# Patient Record
Sex: Male | Born: 1990 | Hispanic: Yes | Marital: Single | Smoking: Never smoker
Health system: Southern US, Community
[De-identification: ages and names within clinical notes are randomized; demographics above are authoritative.]

## PROBLEM LIST (undated history)

## (undated) DIAGNOSIS — J45909 Unspecified asthma, uncomplicated: Secondary | ICD-10-CM

---

## 2017-02-06 ENCOUNTER — Emergency Department (HOSPITAL_COMMUNITY): Payer: Self-pay

## 2017-02-06 ENCOUNTER — Encounter (HOSPITAL_COMMUNITY): Payer: Self-pay

## 2017-02-06 ENCOUNTER — Emergency Department (HOSPITAL_COMMUNITY)
Admission: EM | Admit: 2017-02-06 | Discharge: 2017-02-07 | Disposition: A | Discharge status: Home / Self Care | Payer: Self-pay | Attending: Emergency Medicine | Admitting: Emergency Medicine

## 2017-02-06 DIAGNOSIS — Y990 Civilian activity done for income or pay: Secondary | ICD-10-CM | POA: Insufficient documentation

## 2017-02-06 DIAGNOSIS — M545 Low back pain, unspecified: Secondary | ICD-10-CM

## 2017-02-06 DIAGNOSIS — Y9289 Other specified places as the place of occurrence of the external cause: Secondary | ICD-10-CM | POA: Insufficient documentation

## 2017-02-06 DIAGNOSIS — M25511 Pain in right shoulder: Secondary | ICD-10-CM | POA: Insufficient documentation

## 2017-02-06 DIAGNOSIS — J45909 Unspecified asthma, uncomplicated: Secondary | ICD-10-CM | POA: Insufficient documentation

## 2017-02-06 DIAGNOSIS — W11XXXA Fall on and from ladder, initial encounter: Secondary | ICD-10-CM | POA: Insufficient documentation

## 2017-02-06 DIAGNOSIS — Y9389 Activity, other specified: Secondary | ICD-10-CM | POA: Insufficient documentation

## 2017-02-06 HISTORY — DX: Unspecified asthma, uncomplicated: J45.909

## 2017-02-06 NOTE — ED Triage Notes (Signed)
Pt was at work and fell off of a lift from 12 feet onto his back by accident. Pt complains of lumbar pain. Denies neuro sx, dizziness and no LOC. Pt did not hit head. VSS. Pt was already seen at a clinic but wanted to come here in case he needed x-ray. Ambulatory and able to move all extremities.

## 2017-02-06 NOTE — ED Provider Notes (Signed)
[facility] DEPT Provider Note   CSN: [id number] Arrival date & time: 02/06/17  1909     History   Chief Complaint Chief Complaint  Patient presents with  . Fall  . Back Pain    HPI John Huynh is a 26 y.o. male.  The history is provided by the patient and medical records. The history is limited by a language barrier. A language interpreter was used.  Fall   Back Pain     27 y.o. M with hx of asthma, presenting to the ED for back pain.  Patient reports he was at work today and fell off a 12 foot ladder.  States he fell flat onto his back.  No head injury or LOC.  States throughout the day he has had ongoing back pain, worse from mid-back down to lumbar area.  States he was seen at clinic earlier today but did not have any imaging done.  He is concerned that he needs an x-ray.  States he is also having some right shoulder pain.  He denies any numbness/weakness of his arms or legs.  No bowel or bladder incontinence.  Denies any neck pain.  No headache or confusion.  No nausea/vomiting.  No intervention tried PTA.  Past Medical History:  Diagnosis Date  . Asthma     There are no active problems to display for this patient.   History reviewed. No pertinent surgical history.     Home Medications    Prior to Admission medications   Not on File    Family History History reviewed. No pertinent family history.  Social History Social History  Substance Use Topics  . Smoking status: Never Smoker  . Smokeless tobacco: Not on file  . Alcohol use Yes     Comment: on weekends     Allergies   Patient has no known allergies.   Review of Systems Review of Systems  Musculoskeletal: Positive for arthralgias and back pain.  All other systems reviewed and are negative.    Physical Exam Updated Vital Signs BP 124/68   Pulse 60   Temp 98.8 F (37.1 C) (Oral)   Resp 17   Ht  (1.727 m)   Wt 77.1 kg   SpO2 97%   BMI 25.85 kg/m   Physical  Exam  Constitutional: He is oriented to person, place, and time. He appears well-developed and well-nourished.  HENT:  Head: Normocephalic and atraumatic.  Mouth/Throat: Oropharynx is clear and moist.  No visible signs of head trauma  Eyes: Conjunctivae and EOM are normal. Pupils are equal, round, and reactive to light.  Neck: Normal range of motion.  Cardiovascular: Normal rate, regular rhythm and normal heart sounds.   Pulmonary/Chest: Effort normal and breath sounds normal.  Abdominal: Soft. Bowel sounds are normal.  Musculoskeletal: Normal range of motion.       Right shoulder: He exhibits pain.       Cervical back: Normal.       Thoracic back: He exhibits tenderness, bony tenderness and pain.       Lumbar back: He exhibits tenderness, bony tenderness and pain.       Back:  Midline tenderness along thoracic and lumbar spine; no noted step-off or deformities; full ROM maintained; normal strength and sensation of all 4 extremities, normal gait Cervical spine non-tender Right shoulder has pain with ROM, no deformities or signs of dislocation noted  Neurological: He is alert and oriented to person, place, and time.  AAOx3, answering questions  and following commands appropriately; equal strength UE and LE bilaterally; CN grossly intact; moves all extremities appropriately without ataxia; no focal neuro deficits or facial asymmetry appreciated  Skin: Skin is warm and dry.  Psychiatric: He has a normal mood and affect.  Nursing note and vitals reviewed.    ED Treatments / Results  Labs (all labs ordered are listed, but only abnormal results are displayed) Labs Reviewed - No data to display  EKG  EKG Interpretation None       Radiology Dg Thoracic Spine 2 View  Result Date: 02/06/2017 CLINICAL DATA:  Initial evaluation for acute back pain status post fall. EXAM: THORACIC SPINE 2 VIEWS COMPARISON:  None. FINDINGS: There is no evidence of thoracic spine fracture. Trace  dextroscoliosis. Vertebral bodies otherwise normally aligned with preservation of the normal thoracic kyphosis. No other significant bone abnormalities are identified. IMPRESSION: No radiographic evidence for acute traumatic injury within the thoracic spine. Electronically Signed   By: Rise Mu M.D.   On: 02/06/2017 23:40   Dg Lumbar Spine Complete  Result Date: 02/06/2017 CLINICAL DATA:  Initial evaluation for acute low back pain status post fall. EXAM: LUMBAR SPINE - COMPLETE 4+ VIEW COMPARISON:  None. FINDINGS: There is no evidence of lumbar spine fracture. Alignment is normal. Intervertebral disc spaces are maintained. IMPRESSION: No radiographic evidence for acute traumatic injury within the lumbar spine. Electronically Signed   By: Rise Mu M.D.   On: 02/06/2017 23:43   Dg Shoulder Right  Result Date: 02/06/2017 CLINICAL DATA:  Initial evaluation for acute trauma, fall, right shoulder pain. EXAM: RIGHT SHOULDER - 2+ VIEW COMPARISON:  None. FINDINGS: There is no evidence of fracture or dislocation. There is no evidence of arthropathy or other focal bone abnormality. Soft tissues are unremarkable. IMPRESSION: No acute fracture or dislocation about the right shoulder. Electronically Signed   By: Rise Mu M.D.   On: 02/06/2017 23:38    Procedures Procedures (including critical care time)  Medications Ordered in ED Medications - No data to display   Initial Impression / Assessment and Plan / ED Course  I have reviewed the triage vital signs and the nursing notes.  Pertinent labs & imaging results that were available during my care of the patient were reviewed by me and considered in my medical decision making (see chart for details).  26 y.o. M here after fall off 12 ft ladder at work today.  Denies head injury or LOC.  Complaints of back and right shoulder pain.  No apparent deformities noted on exam.  Does have some pain with ROM of right shoulder and  tenderness along thoracic and lumbar spine.  No focal neurologic deficits suggestive of acute spinal cord injury.  Remains ambulatory with steady gait.  X-rays obtained and are negative for acute findings.    Remains stable here without apparent deficits.  Will plan to discharge home with supportive care.  Does not have PCP, so will refer to wellness clinic for follow-up.  Discussed plan with patient via language interpreter, he acknowledged understanding and agreed with plan of care.  Return precautions given for new or worsening symptoms.  Final Clinical Impressions(s) / ED Diagnoses   Final diagnoses:  Fall from ladder, initial encounter  Acute midline low back pain without sciatica  Acute pain of right shoulder    New Prescriptions New Prescriptions   No medications on file     Garlon Hatchet, PA-C 02/07/17 0039    Shaune Pollack, MD 02/07/17 1511

## 2017-02-07 MED ORDER — NAPROXEN 500 MG PO TABS: 500.0000 mg | ORAL_TABLET | Freq: Two times a day (BID) | ORAL | 0 refills | Status: DC

## 2017-02-07 MED ORDER — METHOCARBAMOL 500 MG PO TABS: 500.0000 mg | ORAL_TABLET | Freq: Two times a day (BID) | ORAL | 0 refills | Status: DC

## 2017-02-07 NOTE — ED Notes (Signed)
Pt stable, understands discharge instructions, and reasons for return.   

## 2017-02-07 NOTE — Discharge Instructions (Signed)
X-rays today were normal-- no evidence of fractures. Take the prescribed medication as directed. Follow-up with your primary care doctor.  If you do not have one, you can call the wellness clinic. Return to the ED for new or worsening symptoms.

## 2017-06-01 ENCOUNTER — Ambulatory Visit (INDEPENDENT_AMBULATORY_CARE_PROVIDER_SITE_OTHER): Payer: Self-pay

## 2017-06-01 ENCOUNTER — Ambulatory Visit (INDEPENDENT_AMBULATORY_CARE_PROVIDER_SITE_OTHER): Payer: Self-pay | Admitting: Family Medicine

## 2017-06-01 ENCOUNTER — Encounter: Payer: Self-pay | Admitting: Family Medicine

## 2017-06-01 VITALS — BP 121/71 | HR 62 | Temp 98.4°F | Resp 16 | Ht 67.25 in | Wt 185.6 lb

## 2017-06-01 DIAGNOSIS — K5900 Constipation, unspecified: Secondary | ICD-10-CM

## 2017-06-01 DIAGNOSIS — R1032 Left lower quadrant pain: Secondary | ICD-10-CM

## 2017-06-01 LAB — POCT URINALYSIS DIP (MANUAL ENTRY)
Bilirubin, UA: NEGATIVE
Blood, UA: NEGATIVE
Glucose, UA: NEGATIVE mg/dL
Ketones, POC UA: NEGATIVE mg/dL
Leukocytes, UA: NEGATIVE
Nitrite, UA: NEGATIVE
Spec Grav, UA: 1.02 (ref 1.010–1.025)
Urobilinogen, UA: 0.2 E.U./dL
pH, UA: 7.5 (ref 5.0–8.0)

## 2017-06-01 LAB — POCT CBC
Granulocyte percent: 61.2 %G (ref 37–80)
HCT, POC: 46.5 % (ref 43.5–53.7)
Hemoglobin: 15.4 g/dL (ref 14.1–18.1)
Lymph, poc: 1.8 (ref 0.6–3.4)
MCH, POC: 29.6 pg (ref 27–31.2)
MCHC: 33.2 g/dL (ref 31.8–35.4)
MCV: 89.3 fL (ref 80–97)
MID (cbc): 0.6 (ref 0–0.9)
MPV: 9 fL (ref 0–99.8)
POC Granulocyte: 3.7 (ref 2–6.9)
POC LYMPH PERCENT: 29 %L (ref 10–50)
POC MID %: 9.8 %M (ref 0–12)
Platelet Count, POC: 228 10*3/uL (ref 142–424)
RBC: 5.21 M/uL (ref 4.69–6.13)
RDW, POC: 13.5 %
WBC: 6.1 10*3/uL (ref 4.6–10.2)

## 2017-06-01 LAB — POC MICROSCOPIC URINALYSIS (UMFC): Mucus: ABSENT

## 2017-06-01 NOTE — Patient Instructions (Signed)
     IF you received an x-ray today, you will receive an invoice from [facility]. Please contact [facility] at [phone] with questions or concerns regarding your invoice.   IF you received labwork today, you will receive an invoice from [facility]. Please contact [facility] at [phone] with questions or concerns regarding your invoice.   Our billing staff will not be able to assist you with questions regarding bills from these companies.  You will be contacted with the lab results as soon as they are available. The fastest way to get your results is to activate your My Chart account. Instructions are located on the last page of this paperwork. If you have not heard from us regarding the results in 2 weeks, please contact this office.     

## 2017-06-03 ENCOUNTER — Encounter: Payer: Self-pay | Admitting: Family Medicine

## 2017-06-03 NOTE — Progress Notes (Signed)
8/16/201810:34 AM  [facility] [place] [birth year], 26 y.o. male [id number]  Chief Complaint  Patient presents with  . Abdominal Pain    x 1 month,     HPI:   Patient is a 26 y.o. male who presents today for one month of abdominal pain, mostly left side. Patient reports that pain is deep and achy, intermittent but becoming more constant, will radiate through out abdomen, associated with constipation that has sign improved since he started magnesium 3 days ago. He reports decreased appetite, bloating and nausea but denies vomiting, fever, chills, black tarry stools or blood in stools. Prior to pain he denies any recent travel history, GU symptoms or abnormal physical activity. Has tried anti-sposmadic and diclofenac (from [place]) without any improvement in sx.   Depression screen PHQ 2/9 06/01/2017  Decreased Interest 0  Down, Depressed, Hopeless 0  PHQ - 2 Score 0    No Known Allergies  Current Outpatient Prescriptions on File Prior to Visit  Medication Sig Dispense Refill  . diclofenac (VOLTAREN) 75 MG EC tablet Take 75 mg by mouth 2 (two) times daily.     No current facility-administered medications on file prior to visit.     Past Medical History:  Diagnosis Date  . Asthma     No past surgical history on file.  Social History  Substance Use Topics  . Smoking status: Never Smoker  . Smokeless tobacco: Never Used  . Alcohol use Yes     Comment: on weekends    Family History  Problem Relation Age of Onset  . GI problems Neg Hx     Review of Systems  Constitutional: Negative for chills, fever, malaise/fatigue and weight loss.  Respiratory: Negative for cough and shortness of breath.   Cardiovascular: Negative for chest pain and palpitations.  Gastrointestinal: Positive for abdominal pain, constipation and nausea. Negative for blood in stool, diarrhea, heartburn, melena and vomiting.  Genitourinary: Negative for dysuria and hematuria.    OBJECTIVE:  Vitals:     06/01/17 1500  Weight: 185 lb 9.6 oz (84.2 kg)  Height: 5' 7.25" (1.708 m)    Physical Exam  Constitutional: He is oriented to person, place, and time and well-developed, well-nourished, and in no distress.  HENT:  Head: Normocephalic and atraumatic.  Mouth/Throat: Oropharynx is clear and moist.  Eyes: Pupils are equal, round, and reactive to light. EOM are normal.  Neck: Neck supple.  Cardiovascular: Normal rate and regular rhythm.  Exam reveals no gallop and no friction rub.   No murmur heard. Pulmonary/Chest: Effort normal and breath sounds normal. He has no wheezes. He has no rales.  Abdominal: Soft. Bowel sounds are normal. He exhibits no distension and no mass. There is tenderness (moderate LLQ TTP). There is no rebound and no guarding.  Neurological: He is alert and oriented to person, place, and time. Gait normal.  Skin: Skin is warm and dry.    Results for orders placed or performed in visit on 06/01/17  POCT urinalysis dipstick  Result Value Ref Range   Color, UA yellow yellow   Clarity, UA clear clear   Glucose, UA negative negative mg/dL   Bilirubin, UA negative negative   Ketones, POC UA negative negative mg/dL   Spec Grav, UA 4.098 1.191 - 1.025   Blood, UA negative negative   pH, UA 7.5 5.0 - 8.0   Protein Ur, POC trace (A) negative mg/dL   Urobilinogen, UA 0.2 0.2 or 1.0 E.U./dL   Nitrite, UA Negative  Negative   Leukocytes, UA Negative Negative  POCT Microscopic Urinalysis ([facility])  Result Value Ref Range   WBC,UR,HPF,POC None None WBC/hpf   RBC,UR,HPF,POC None None RBC/hpf   Bacteria None None, Too numerous to count   Mucus Absent Absent   Epithelial Cells, UR Per Microscopy Few (A) None, Too numerous to count cells/hpf  POCT CBC  Result Value Ref Range   WBC 6.1 4.6 - 10.2 K/uL   Lymph, poc 1.8 0.6 - 3.4   POC LYMPH PERCENT 29.0 10 - 50 %L   MID (cbc) 0.6 0 - 0.9   POC MID % 9.8 0 - 12 %M   POC Granulocyte 3.7 2 - 6.9   Granulocyte percent 61.2 37  - 80 %G   RBC 5.21 4.69 - 6.13 M/uL   Hemoglobin 15.4 14.1 - 18.1 g/dL   HCT, POC 09.846.5 11.943.5 - 53.7 %   MCV 89.3 80 - 97 fL   MCH, POC 29.6 27 - 31.2 pg   MCHC 33.2 31.8 - 35.4 g/dL   RDW, POC 14.713.5 %   Platelet Count, POC 228 142 - 424 K/uL   MPV 9.0 0 - 99.8 fL     ASSESSMENT and PLAN:  Problem List Items Addressed This Visit    None    Visit Diagnoses    Abdominal pain, left lower quadrant    -  Primary   Relevant Orders   POCT urinalysis dipstick (Completed)   POCT Microscopic Urinalysis ([facility]) (Completed)   DG Abd 2 Views (Completed)   POCT CBC (Completed)   Constipation, unspecified constipation type          Diescussed with patient that workup today has been reassuring, xray suggestive of unresolved constipation. Discussed conservative measures for constipation, increase water intake, fiber supplements such as generic metamucil, prune juice, OTC miralax. ER precautions given. RTC instructions discussed.      Myles LippsIrma M Santiago, MD [facility] at [facility] [address] [place], [place] [address] Ph.  [phone] Fax [phone]

## 2017-08-05 ENCOUNTER — Encounter: Payer: Self-pay | Admitting: Family Medicine

## 2017-08-05 ENCOUNTER — Ambulatory Visit (INDEPENDENT_AMBULATORY_CARE_PROVIDER_SITE_OTHER): Payer: Self-pay

## 2017-08-05 ENCOUNTER — Ambulatory Visit (INDEPENDENT_AMBULATORY_CARE_PROVIDER_SITE_OTHER): Payer: Self-pay | Admitting: Family Medicine

## 2017-08-05 VITALS — BP 132/88 | HR 89 | Temp 98.0°F | Resp 18 | Ht 67.25 in | Wt 175.0 lb

## 2017-08-05 DIAGNOSIS — R1030 Lower abdominal pain, unspecified: Secondary | ICD-10-CM

## 2017-08-05 DIAGNOSIS — R1032 Left lower quadrant pain: Secondary | ICD-10-CM

## 2017-08-05 DIAGNOSIS — R634 Abnormal weight loss: Secondary | ICD-10-CM

## 2017-08-05 LAB — POCT URINALYSIS DIP (MANUAL ENTRY)
Bilirubin, UA: NEGATIVE
Blood, UA: NEGATIVE
Glucose, UA: NEGATIVE mg/dL
Leukocytes, UA: NEGATIVE
Nitrite, UA: NEGATIVE
Protein Ur, POC: NEGATIVE mg/dL
Spec Grav, UA: 1.025 (ref 1.010–1.025)
Urobilinogen, UA: 0.2 E.U./dL
pH, UA: 5.5 (ref 5.0–8.0)

## 2017-08-05 MED ORDER — LACTULOSE 10 GM/15ML PO SOLN: 10.0000 g | Freq: Three times a day (TID) | ORAL | 0 refills | Status: AC

## 2017-08-05 NOTE — Patient Instructions (Signed)
     IF you received an x-ray today, you will receive an invoice from [facility]. Please contact [facility] at [phone] with questions or concerns regarding your invoice.   IF you received labwork today, you will receive an invoice from [facility]. Please contact [facility] at [phone] with questions or concerns regarding your invoice.   Our billing staff will not be able to assist you with questions regarding bills from these companies.  You will be contacted with the lab results as soon as they are available. The fastest way to get your results is to activate your My Chart account. Instructions are located on the last page of this paperwork. If you have not heard from us regarding the results in 2 weeks, please contact this office.     

## 2017-08-05 NOTE — Progress Notes (Signed)
10/18/20185:48 PM  [facility] [place] May 19, 1991, 26 y.o. male [id number]  Chief Complaint  Patient presents with  . Abdominal Pain    pain all in abdomen and back x42months follow up     HPI:   Patient is a 26 y.o. male who presents today for worsening abd pain x 4 months. Mostly LLQ pain, becoming more constant and sharper/ more intense. Associated with bloating, nausea and vomiting x 1, yesterday morning. Reports early satiety with 10 lbs weight loss since last visit 2 months ago. Reports worsening constipation, gets urge to defecate but passes very little to nothing. Reports decreased passing of gas, increased belching. Denies any fever or chills, denies any urinary symptoms.  Depression screen [facility] 2/9 08/05/2017 06/01/2017  Decreased Interest 0 0  Down, Depressed, Hopeless 0 0  PHQ - 2 Score 0 0    No Known Allergies  Prior to Admission medications   Medication Sig Start Date End Date Taking? Authorizing Provider  diclofenac (VOLTAREN) 75 MG EC tablet Take 75 mg by mouth 2 (two) times daily.    [provider]  lactulose (CHRONULAC) 10 GM/15ML solution Take 15 mLs (10 g total) by mouth 3 (three) times daily. 08/05/17   Myles Lipps, MD    Past Medical History:  Diagnosis Date  . Asthma     History reviewed. No pertinent surgical history.  Social History  Substance Use Topics  . Smoking status: Never Smoker  . Smokeless tobacco: Never Used  . Alcohol use Yes     Comment: on weekends    Family History  Problem Relation Age of Onset  . GI problems Neg Hx     Review of Systems  Constitutional: Positive for malaise/fatigue and weight loss. Negative for chills and fever.  Respiratory: Negative for cough and shortness of breath.   Cardiovascular: Negative for chest pain and palpitations.  Gastrointestinal: Positive for abdominal pain, constipation, nausea and vomiting. Negative for blood in stool and melena.     OBJECTIVE:  Blood pressure  132/88, pulse 89, temperature 98 F (36.7 C), temperature source Oral, resp. rate 18, height 5' 7.25" (1.708 m), weight 175 lb (79.4 kg), SpO2 99 %.    Physical Exam  Constitutional: He is oriented to person, place, and time and well-developed, well-nourished, and in no distress.  HENT:  Head: Normocephalic and atraumatic.  Mouth/Throat: Oropharynx is clear and moist.  Eyes: Pupils are equal, round, and reactive to light. EOM are normal.  Neck: Neck supple.  Cardiovascular: Normal rate and regular rhythm.  Exam reveals no gallop and no friction rub.   No murmur heard. Pulmonary/Chest: Effort normal and breath sounds normal. He has no wheezes. He has no rales.  Abdominal: Soft. Bowel sounds are normal. He exhibits no distension and no mass. There is no hepatosplenomegaly. There is tenderness in the left upper quadrant and left lower quadrant. There is no rigidity, no rebound and no guarding. No hernia.  Neurological: He is alert and oriented to person, place, and time. Gait normal.  Skin: Skin is warm and dry.      Results for orders placed or performed in visit on 08/05/17 (from the past 24 hour(s))  POCT urinalysis dipstick     Status: Abnormal   Collection Time: 08/05/17 12:17 PM  Result Value Ref Range   Color, UA yellow yellow   Clarity, UA clear clear   Glucose, UA negative negative mg/dL   Bilirubin, UA negative negative   Ketones, POC UA small (  15) (A) negative mg/dL   Spec Grav, UA 4.6961.025 2.9521.010 - 1.025   Blood, UA negative negative   pH, UA 5.5 5.0 - 8.0   Protein Ur, POC negative negative mg/dL   Urobilinogen, UA 0.2 0.2 or 1.0 E.U./dL   Nitrite, UA Negative Negative   Leukocytes, UA Negative Negative    Dg Abd 2 Views  Result Date: 08/05/2017 CLINICAL DATA:  Worsening left lower quadrant pain and unintentional weight loss. EXAM: ABDOMEN - 2 VIEW COMPARISON:  06/01/2017 FINDINGS: The bowel gas pattern is normal. There is no evidence of free air. No radio-opaque  calculi or other significant radiographic abnormality is seen. Right pelvic phlebolith again noted. IMPRESSION: Negative. Electronically Signed   By: Myles RosenthalJohn  Stahl M.D.   On: 08/05/2017 13:18     ASSESSMENT and PLAN 1. Left lower quadrant pain Exam overall benign, xray again shows constipation, however weight loss is concerning. Will start investigation with CT. Using lactulose for constipation.  - POCT urinalysis dipstick - CBC with Differential - Comprehensive metabolic panel - DG Abd 2 Views; Future - CT ABDOMEN PELVIS W CONTRAST; Future  2. Abnormal weight loss - CT ABDOMEN PELVIS W CONTRAST; Future  Other orders - lactulose (CHRONULAC) 10 GM/15ML solution; Take 15 mLs (10 g total) by mouth 3 (three) times daily.  Return in about 2 weeks (around 08/19/2017) for after CT.    Myles LippsIrma M Santiago, MD Primary Care at [facility] [address] [place], [place] [address] Ph.  [phone] Fax [phone]

## 2017-08-06 LAB — CBC WITH DIFFERENTIAL/PLATELET
Basophils Absolute: 0 10*3/uL (ref 0.0–0.2)
Basos: 0 %
EOS (ABSOLUTE): 0.1 10*3/uL (ref 0.0–0.4)
Eos: 2 %
Hematocrit: 47.2 % (ref 37.5–51.0)
Hemoglobin: 15.9 g/dL (ref 13.0–17.7)
Immature Grans (Abs): 0 10*3/uL (ref 0.0–0.1)
Immature Granulocytes: 0 %
Lymphocytes Absolute: 1.6 10*3/uL (ref 0.7–3.1)
Lymphs: 26 %
MCH: 30.1 pg (ref 26.6–33.0)
MCHC: 33.7 g/dL (ref 31.5–35.7)
MCV: 89 fL (ref 79–97)
Monocytes Absolute: 0.4 10*3/uL (ref 0.1–0.9)
Monocytes: 7 %
Neutrophils Absolute: 3.9 10*3/uL (ref 1.4–7.0)
Neutrophils: 65 %
Platelets: 253 10*3/uL (ref 150–379)
RBC: 5.28 x10E6/uL (ref 4.14–5.80)
RDW: 14.4 % (ref 12.3–15.4)
WBC: 6.1 10*3/uL (ref 3.4–10.8)

## 2017-08-06 LAB — COMPREHENSIVE METABOLIC PANEL
ALT: 21 IU/L (ref 0–44)
AST: 18 IU/L (ref 0–40)
Albumin/Globulin Ratio: 2 (ref 1.2–2.2)
Albumin: 4.9 g/dL (ref 3.5–5.5)
Alkaline Phosphatase: 75 IU/L (ref 39–117)
BUN/Creatinine Ratio: 13 (ref 9–20)
BUN: 11 mg/dL (ref 6–20)
Bilirubin Total: 1.3 mg/dL — ABNORMAL HIGH (ref 0.0–1.2)
CO2: 21 mmol/L (ref 20–29)
Calcium: 9.6 mg/dL (ref 8.7–10.2)
Chloride: 104 mmol/L (ref 96–106)
Creatinine, Ser: 0.85 mg/dL (ref 0.76–1.27)
GFR calc Af Amer: 140 mL/min/{1.73_m2} (ref >59)
GFR calc non Af Amer: 121 mL/min/{1.73_m2} (ref >59)
Globulin, Total: 2.5 g/dL (ref 1.5–4.5)
Glucose: 88 mg/dL (ref 65–99)
Potassium: 4.3 mmol/L (ref 3.5–5.2)
Sodium: 144 mmol/L (ref 134–144)
Total Protein: 7.4 g/dL (ref 6.0–8.5)

## 2017-08-07 ENCOUNTER — Emergency Department (HOSPITAL_COMMUNITY)
Admission: EM | Admit: 2017-08-07 | Discharge: 2017-08-07 | Disposition: A | Discharge status: Home / Self Care | Payer: Self-pay | Attending: Emergency Medicine | Admitting: Emergency Medicine

## 2017-08-07 ENCOUNTER — Emergency Department (HOSPITAL_COMMUNITY): Payer: Self-pay

## 2017-08-07 ENCOUNTER — Encounter (HOSPITAL_COMMUNITY): Payer: Self-pay | Admitting: Emergency Medicine

## 2017-08-07 DIAGNOSIS — R1084 Generalized abdominal pain: Secondary | ICD-10-CM | POA: Insufficient documentation

## 2017-08-07 DIAGNOSIS — J45909 Unspecified asthma, uncomplicated: Secondary | ICD-10-CM | POA: Insufficient documentation

## 2017-08-07 DIAGNOSIS — K5900 Constipation, unspecified: Secondary | ICD-10-CM | POA: Insufficient documentation

## 2017-08-07 DIAGNOSIS — Z79899 Other long term (current) drug therapy: Secondary | ICD-10-CM | POA: Insufficient documentation

## 2017-08-07 MED ORDER — ESOMEPRAZOLE MAGNESIUM 40 MG PO CPDR: 40.0000 mg | DELAYED_RELEASE_CAPSULE | Freq: Every day | ORAL | 0 refills | Status: AC

## 2017-08-07 MED ORDER — IOPAMIDOL (ISOVUE-300) INJECTION 61%
INTRAVENOUS | Status: AC
  Administered 2017-08-07: 100 mL
  Filled 2017-08-07: qty 100

## 2017-08-07 MED ORDER — MORPHINE SULFATE (PF) 4 MG/ML IV SOLN
2.0000 mg | Freq: Once | INTRAVENOUS | Status: AC
  Administered 2017-08-07: 2 mg via INTRAVENOUS
  Filled 2017-08-07: qty 1

## 2017-08-07 MED ORDER — POLYETHYLENE GLYCOL 3350 17 GM/SCOOP PO POWD: ORAL | 0 refills | Status: AC

## 2017-08-07 NOTE — ED Provider Notes (Signed)
[facility]   CSN: [id number] Arrival date & time: 08/07/17  [id number]     History   Chief Complaint Chief Complaint  Patient presents with  . Abdominal Pain  . Back Pain    HPI John Huynh is a 26 y.o. male presenting with abdominal pain.  Patient states that for the past several months, he has been having abdominal pain that radiates to his back.  This is worsened over the past 2 weeks.  He saw his primary care doctor on the 18th, and a CT scan was scheduled for Monday.  He comes in today, as the pain is unbearable.  The pain is located centrally/umbilical, it is sharp, and constant.  It radiates to his back.  He reports his abdomen feels full, and like he needs to have a bowel movement, but is unable to do so.  Nothing makes it better, eating makes it worse.  Due to this, he has had decreased oral intake.  Prior to 4 months ago, he never had any abdominal problems.  He has tried Tylenol and ibuprofen without relief of the pain.  He took his first dose of lactulose yesterday.  Took Robaxin yesterday, which improved his back pain, but did not help his abdominal pain.  He reports intermittent nausea, none currently.  He reports one episode of vomiting several days ago, it was nonbloody and nonbilious.  He reports abnormal bowel movements, as he has to strain.  No blood in the stool.  PCP notes indicate patient has been constipated in the past several visits.  He denies fevers, chest pain, shortness of breath, urinary symptoms.  He states in the past 2 months, he has lost 10 pounds.  He denies night sweats, but states that he is often chilly.  Family history of gastritis and colitis, but no history of UC or Crohn's. Patient had lab work on the 18th, 2 days ago, without acute abnormality.   HPI  Past Medical History:  Diagnosis Date  . Asthma     There are no active problems to display for this patient.   History reviewed. No  pertinent surgical history.     Home Medications    Prior to Admission medications   Medication Sig Start Date End Date Taking? Authorizing Provider  diclofenac (VOLTAREN) 75 MG EC tablet Take 75 mg by mouth daily as needed for mild pain.    Yes [provider]  lactulose (CHRONULAC) 10 GM/15ML solution Take 15 mLs (10 g total) by mouth 3 (three) times daily. 08/05/17  Yes Myles LippsSantiago, Irma M, MD  esomeprazole (NEXIUM) 40 MG capsule Take 1 capsule (40 mg total) by mouth daily. 08/07/17   Mariha Sleeper, PA-C  polyethylene glycol powder (GLYCOLAX/MIRALAX) powder Take one capful 1-2 times daily as needed to achieve normal bowel movements. 08/07/17   Kiari Hosmer, PA-C    Family History Family History  Problem Relation Age of Onset  . GI problems Neg Hx     Social History Social History  Substance Use Topics  . Smoking status: Never Smoker  . Smokeless tobacco: Never Used  . Alcohol use Yes     Comment: on weekends     Allergies   Patient has no known allergies.   Review of Systems Review of Systems  Constitutional: Positive for appetite change and unexpected weight change. Negative for fever.  HENT: Negative for congestion and sore throat.   Eyes: Negative for pain and visual disturbance.  Respiratory:  Negative for cough, chest tightness and shortness of breath.   Cardiovascular: Negative for chest pain, palpitations and leg swelling.  Gastrointestinal: Positive for abdominal distention, abdominal pain, constipation, nausea (Resolved) and vomiting (One episode, resolved). Negative for blood in stool.  Genitourinary: Negative for dysuria, flank pain and hematuria.  Musculoskeletal: Positive for back pain.  Allergic/Immunologic: Negative for immunocompromised state.  Neurological: Negative for dizziness and headaches.  Hematological: Does not bruise/bleed easily.     Physical Exam Updated Vital Signs BP 114/68 (BP Location: Left Arm)   Pulse (!) 52    Temp 98.1 F (36.7 C) (Oral)   Resp 16   SpO2 99%   Physical Exam  Constitutional: He is oriented to person, place, and time. He appears well-developed and well-nourished. No distress.  HENT:  Head: Normocephalic and atraumatic.  Mouth/Throat: Mucous membranes are normal.  Eyes: Pupils are equal, round, and reactive to light. Conjunctivae and EOM are normal.  Neck: Normal range of motion.  Cardiovascular: Normal rate, regular rhythm and intact distal pulses.   Pulmonary/Chest: Effort normal and breath sounds normal. No respiratory distress. He has no wheezes.  Abdominal: Soft. Normal appearance and bowel sounds are normal. He exhibits no distension. There is generalized tenderness. There is no rigidity, no rebound, no guarding, no CVA tenderness, no tenderness at McBurney's point and negative Murphy's sign.  Generalized TTP of abd. No distention, rigidity or guarding. No rebound.   Musculoskeletal: Normal range of motion.  Neurological: He is alert and oriented to person, place, and time.  Skin: Skin is warm and dry.  Psychiatric: He has a normal mood and affect.  Nursing note and vitals reviewed.    ED Treatments / Results  Labs (all labs ordered are listed, but only abnormal results are displayed) Labs Reviewed - No data to display  EKG  EKG Interpretation None       Radiology Ct Abdomen Pelvis W Contrast  Result Date: 08/07/2017 CLINICAL DATA:  26 year old male with chronic left abdominal and pelvic pain for 4 months. EXAM: CT ABDOMEN AND PELVIS WITH CONTRAST TECHNIQUE: Multidetector CT imaging of the abdomen and pelvis was performed using the standard protocol following bolus administration of intravenous contrast. CONTRAST:  ISOVUE-300 IOPAMIDOL (ISOVUE-300) INJECTION 61% COMPARISON:  None. FINDINGS: Lower chest: No acute abnormality Hepatobiliary: The liver and gallbladder are unremarkable. No biliary dilatation. Pancreas: Unremarkable Spleen: Unremarkable  Adrenals/Urinary Tract: The kidneys, adrenal glands and bladder are unremarkable. Stomach/Bowel: Stomach is within normal limits. Appendix appears normal. No evidence of bowel wall thickening, distention, or inflammatory changes. Vascular/Lymphatic: No significant vascular findings are present. No enlarged abdominal or pelvic lymph nodes. Reproductive: Prostate is unremarkable. Other: No ascites, hernia, pneumoperitoneum or abscess. Musculoskeletal: No acute or significant osseous findings. IMPRESSION: Unremarkable CT of the abdomen and pelvis with contrast. Electronically Signed   By: Harmon Pier M.D.   On: 08/07/2017 11:43    Procedures Procedures (including critical care time)  Medications Ordered in ED Medications  morphine 4 MG/ML injection 2 mg (2 mg Intravenous Given 08/07/17 1054)  iopamidol (ISOVUE-300) 61 % injection (100 mLs  Contrast Given 08/07/17 1106)     Initial Impression / Assessment and Plan / ED Course  I have reviewed the triage vital signs and the nursing notes.  Pertinent labs & imaging results that were available during my care of the patient were reviewed by me and considered in my medical decision making (see chart for details).     Presenting with 17-month history of abdominal pain.  Physical exam shows generalized tenderness of the abdomen without rigidity or guarding.  Normoactive bowel sounds x4.  Additionally, patient with constipation, straining, and weight loss.  His primary care was concerned, will order CT scan for further evaluation.  Denies current nausea.  Pain is 10 out of 10, will give morphine.  Reassessment, patient states pain is improved with medicine.  CT scan shows no acute abnormality, no intra-abdominal infection, obstruction, perforation, masses, or tumors.  Bowel appears to have significant stool burden.  Discussed findings with patient.  Discussed starting MiraLAX as well as lactulose.  Discussed patient is to stay well-hydrated.  Nexium given  for reflux.  Patient to follow-up with primary care doctor for further evaluation and management, and potential GI referral as needed.  Patient to call primary care office on Monday to cancel CT scan and make appointment.  At this time, patient appears safe for discharge.  Return precautions given.  Patient states he understands and agrees to plan.   Final Clinical Impressions(s) / ED Diagnoses   Final diagnoses:  Generalized abdominal pain  Constipation, unspecified constipation type    New Prescriptions Discharge Medication List as of 08/07/2017 12:16 PM    START taking these medications   Details  esomeprazole (NEXIUM) 40 MG capsule Take 1 capsule (40 mg total) by mouth daily., Starting Sat 08/07/2017, Print    polyethylene glycol powder (GLYCOLAX/MIRALAX) powder Take one capful 1-2 times daily as needed to achieve normal bowel movements., Print         Alveria Apley, PA-C 08/07/17 2302    Rolan Bucco, MD 08/08/17 [id number]

## 2017-08-07 NOTE — Discharge Instructions (Signed)
Toma el nexium una vez cada dia.  Toma miralax 1-2 veces cada dia.  Continua a tomar el lactulose.  Llama el officina del doctor primaria el lunes para decir que ya ha tenido el CT scan, y para dar una cita en una semana para evaluacion de los sintomas.  Regresa al emergencia si tiene fiebre, sange por el feces, o algun nuevo sintoma  Nexium once a day. Take MiraLAX 1-2 times daily, as needed to achieve normal bowel movements. Continue to take lactulose. Call your doctor's office and tell them that you have already received a CT scan, and to make an appointment for follow-up in 1 week.  Return to the emergency room if you develop fevers, blood in your stool, or any new or worsening symptoms.

## 2017-08-07 NOTE — ED Triage Notes (Signed)
Friend/translator stated, He has stomach pain an back pain for 4 months. He was here on Thursday and is suppose to have CT, he in a lot of pain.  Pt. Had blood drawn on Thursday.

## 2017-08-09 ENCOUNTER — Other Ambulatory Visit: Payer: Self-pay

## 2019-01-25 IMAGING — DX DG ABDOMEN 2V
2 series · 2 of 2 positions shown · non-contrast
Comparison: None.

CLINICAL DATA: Worsening abdominal pain.

EXAM:
ABDOMEN - 2 VIEW

[abdomen erect]
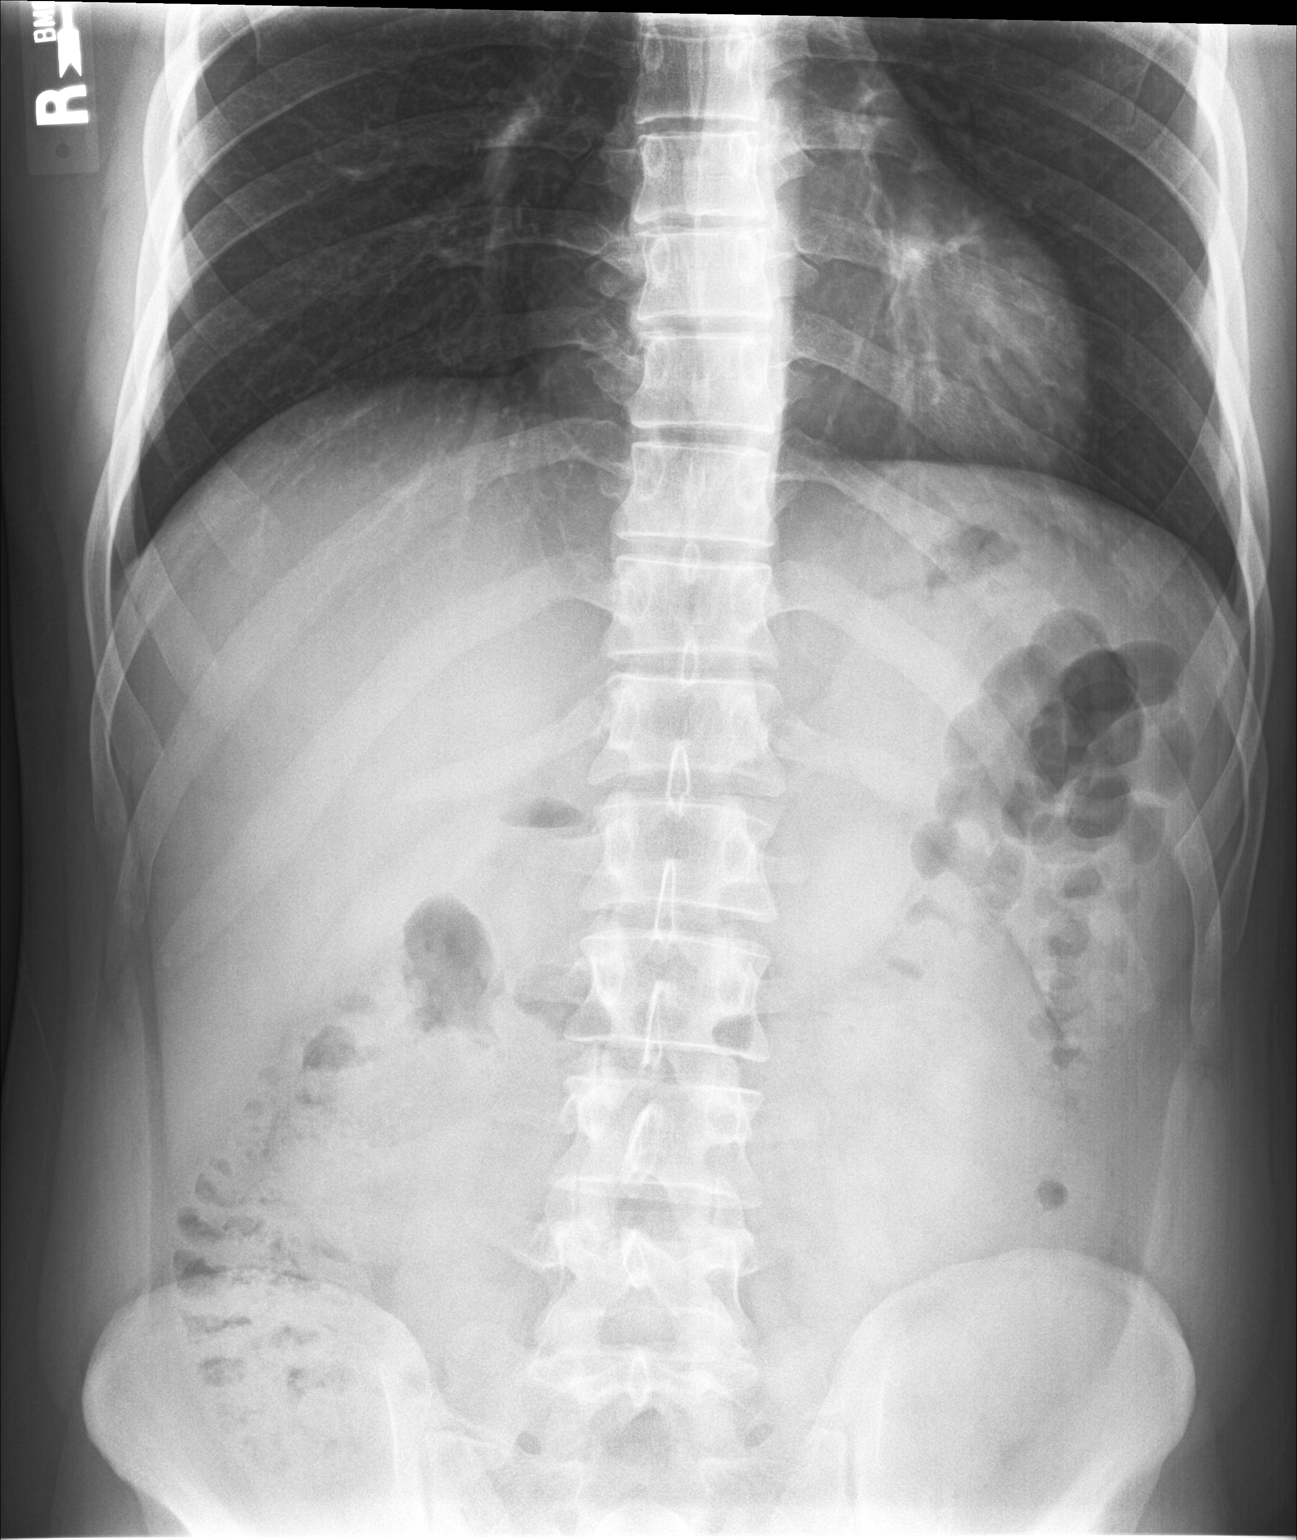

[abdomen supine]
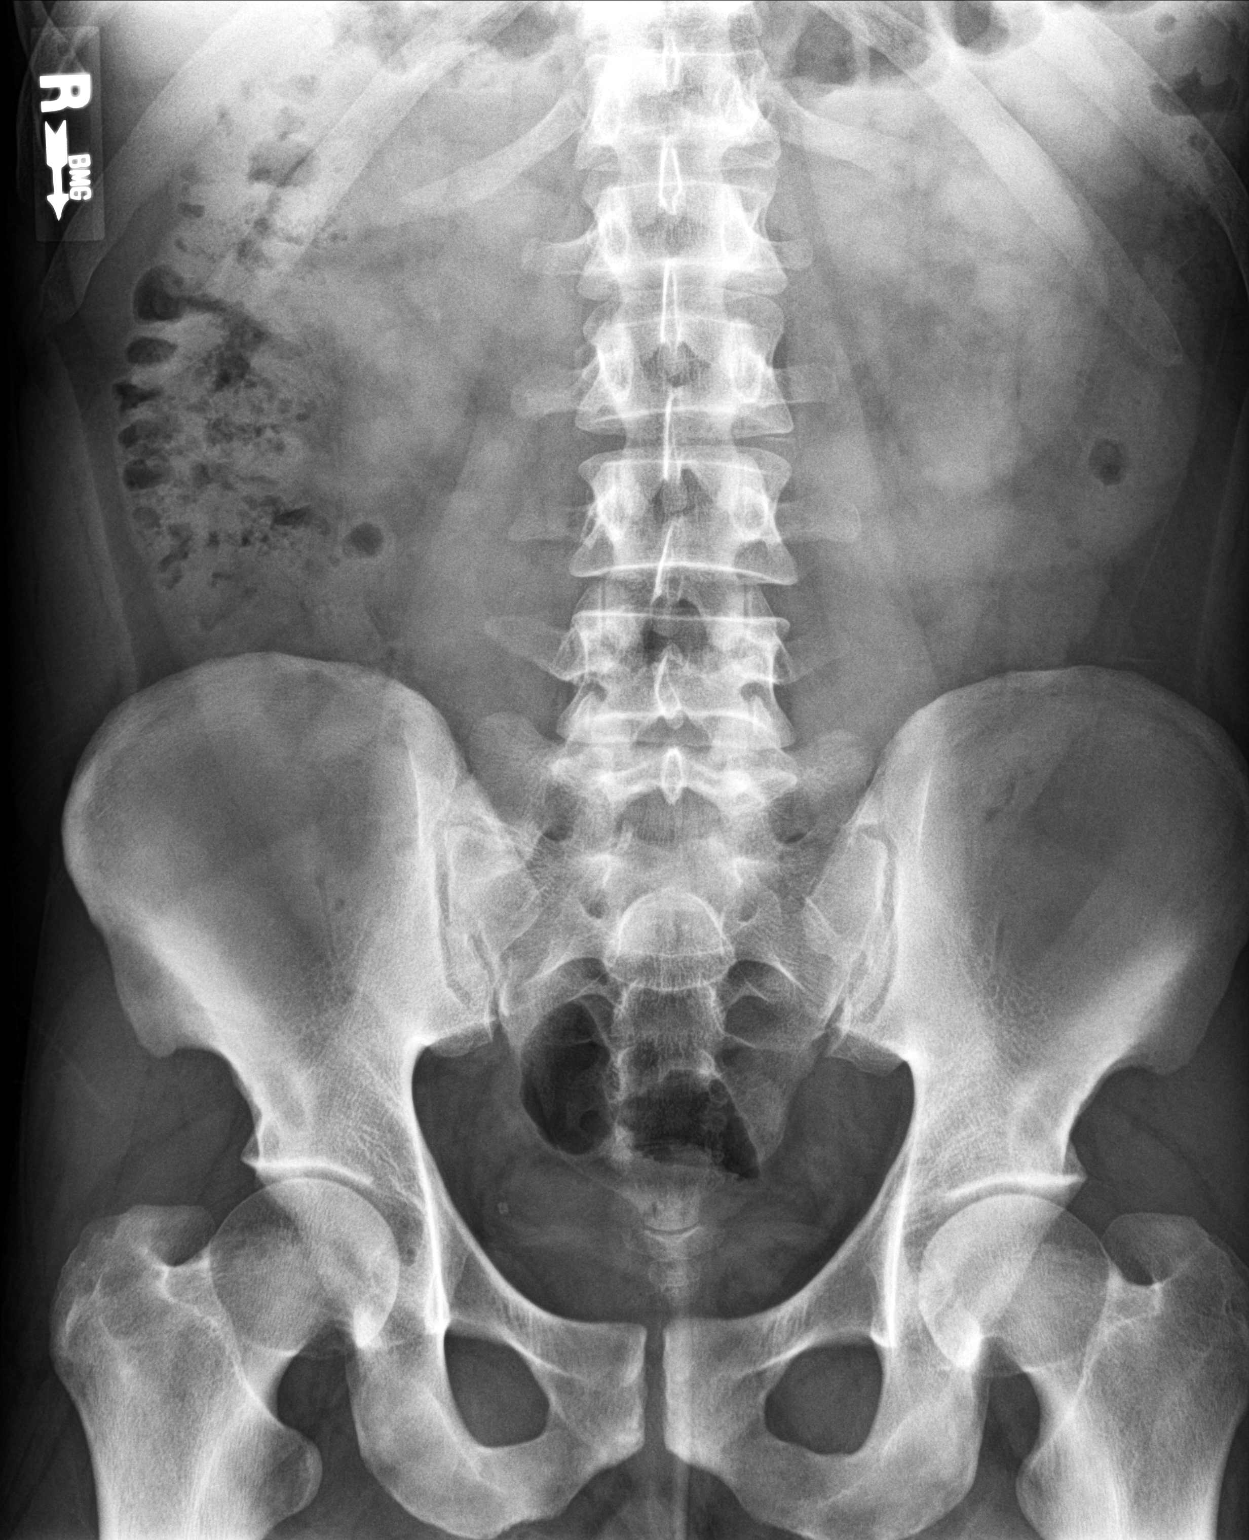

[2 of 2 positions shown; findings below may reference images not displayed]

FINDINGS: The bowel gas pattern is normal. There is no evidence of free air.
No radio-opaque calculi or other significant radiographic
abnormality is seen.
IMPRESSION: Negative.
